# Patient Record
Sex: Male | Born: 1970 | Hispanic: No | Marital: Married | Smoking: Current every day smoker
Health system: Southern US, Community
[De-identification: ages and names within clinical notes are randomized; demographics above are authoritative.]

---

## 2014-04-19 ENCOUNTER — Emergency Department (HOSPITAL_COMMUNITY)
Admission: EM | Admit: 2014-04-19 | Discharge: 2014-04-19 | Disposition: A | Discharge status: Home / Self Care | Payer: Medicaid Other | Attending: Emergency Medicine | Admitting: Emergency Medicine

## 2014-04-19 ENCOUNTER — Encounter (HOSPITAL_COMMUNITY): Payer: Self-pay | Admitting: Emergency Medicine

## 2014-04-19 DIAGNOSIS — T24212A Burn of second degree of left thigh, initial encounter: Secondary | ICD-10-CM | POA: Diagnosis present

## 2014-04-19 DIAGNOSIS — Y9389 Activity, other specified: Secondary | ICD-10-CM | POA: Insufficient documentation

## 2014-04-19 DIAGNOSIS — T24202A Burn of second degree of unspecified site of left lower limb, except ankle and foot, initial encounter: Secondary | ICD-10-CM

## 2014-04-19 DIAGNOSIS — Z720 Tobacco use: Secondary | ICD-10-CM | POA: Diagnosis not present

## 2014-04-19 DIAGNOSIS — Z23 Encounter for immunization: Secondary | ICD-10-CM | POA: Diagnosis not present

## 2014-04-19 DIAGNOSIS — L03116 Cellulitis of left lower limb: Secondary | ICD-10-CM | POA: Diagnosis not present

## 2014-04-19 DIAGNOSIS — Y9289 Other specified places as the place of occurrence of the external cause: Secondary | ICD-10-CM | POA: Insufficient documentation

## 2014-04-19 DIAGNOSIS — X100XXA Contact with hot drinks, initial encounter: Secondary | ICD-10-CM | POA: Insufficient documentation

## 2014-04-19 MED ORDER — TETANUS-DIPHTH-ACELL PERTUSSIS 5-2.5-18.5 LF-MCG/0.5 IM SUSP
0.5000 mL | Freq: Once | INTRAMUSCULAR | Status: AC
  Administered 2014-04-19: 0.5 mL via INTRAMUSCULAR
  Filled 2014-04-19: qty 0.5

## 2014-04-19 MED ORDER — OXYCODONE-ACETAMINOPHEN 5-325 MG PO TABS: 1.0000 | ORAL_TABLET | Freq: Four times a day (QID) | ORAL | Status: AC | PRN

## 2014-04-19 MED ORDER — HYDROMORPHONE HCL 1 MG/ML IJ SOLN
1.0000 mg | Freq: Once | INTRAMUSCULAR | Status: AC
  Administered 2014-04-19: 1 mg via INTRAVENOUS
  Filled 2014-04-19: qty 1

## 2014-04-19 MED ORDER — CLINDAMYCIN HCL 300 MG PO CAPS
600.0000 mg | ORAL_CAPSULE | Freq: Once | ORAL | Status: AC
  Administered 2014-04-19: 600 mg via ORAL
  Filled 2014-04-19: qty 2

## 2014-04-19 MED ORDER — CLINDAMYCIN HCL 150 MG PO CAPS: 450.0000 mg | ORAL_CAPSULE | Freq: Three times a day (TID) | ORAL | Status: AC

## 2014-04-19 MED ORDER — SILVER SULFADIAZINE 1 % EX CREA
TOPICAL_CREAM | Freq: Once | CUTANEOUS | Status: AC
  Administered 2014-04-19: 09:00:00 via TOPICAL
  Filled 2014-04-19: qty 85
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
## 2014-04-19 NOTE — Discharge Instructions (Signed)
Burn Care °Burns hurt your skin. When your skin is hurt, it is easier to get an infection. Follow your doctor's directions to help prevent an infection. °HOME CARE °· Wash your hands well before you change your bandage. °· Change your bandage as often as told by your doctor. °¨ Remove the old bandage. If the bandage sticks, soak it off with cool, clean water. °¨ Gently clean the burn with mild soap and water. °¨ Pat the burn dry with a clean, dry cloth. °¨ Put a thin layer of medicated cream on the burn. °¨ Put a clean bandage on as told by your doctor. °¨ Keep the bandage clean and dry. °· Raise (elevate) the burn for the first 24 hours. After that, follow your doctor's directions. °· Only take medicine as told by your doctor. °GET HELP RIGHT AWAY IF:  °· You have too much pain. °· The skin near the burn is red, tender, puffy (swollen), or has red streaks. °· The burn area has yellowish white fluid (pus) or a bad smell coming from it. °· You have a fever. °MAKE SURE YOU:  °· Understand these instructions. °· Will watch your condition. °· Will get help right away if you are not doing well or get worse. °Document Released: 03/10/2008 Document Revised: 08/24/2011 Document Reviewed: 10/22/2010 °ExitCare® Patient Information ©2015 [employer]. This information is not intended to replace advice given to you by your health care provider. Make sure you discuss any questions you have with your health care provider. ° °
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
## 2014-06-13 ENCOUNTER — Encounter (HOSPITAL_COMMUNITY): Payer: Self-pay | Admitting: Emergency Medicine

## 2014-06-13 ENCOUNTER — Emergency Department (HOSPITAL_COMMUNITY)
Admission: EM | Admit: 2014-06-13 | Discharge: 2014-06-14 | Disposition: A | Discharge status: Home / Self Care | Payer: Medicaid Other | Attending: Emergency Medicine | Admitting: Emergency Medicine

## 2014-06-13 DIAGNOSIS — J329 Chronic sinusitis, unspecified: Secondary | ICD-10-CM

## 2014-06-13 DIAGNOSIS — J019 Acute sinusitis, unspecified: Secondary | ICD-10-CM | POA: Insufficient documentation

## 2014-06-13 DIAGNOSIS — R519 Headache, unspecified: Secondary | ICD-10-CM

## 2014-06-13 DIAGNOSIS — Z720 Tobacco use: Secondary | ICD-10-CM | POA: Diagnosis not present

## 2014-06-13 DIAGNOSIS — R51 Headache: Secondary | ICD-10-CM | POA: Diagnosis present

## 2014-06-13 NOTE — ED Notes (Signed)
Pt c/o 8/10 HA with nausea and blurred vision x 2 days. Pt states pain comes and go. Pt denies injury/trauma.

## 2014-06-13 NOTE — ED Provider Notes (Signed)
CSN: [id number]     Arrival date & time 06/13/14  2214 History   First MD Initiated Contact with Patient 06/13/14 2353     This chart was scribed for Geoffery Lyonsouglas Tomeka Kantner, MD by Arlan OrganAshley Leger, ED Scribe. This patient was seen in room D34C/D34C and the patient's care was started 11:57 PM.   Chief Complaint  Patient presents with  . Headache   The history is provided by the patient. No language interpreter was used.    HPI Comments: Howard Macias is a 43 y.o. male who presents to the Emergency Department complaining of intermittent pain to the back of the head x 10 days that has progressively worsened in last 2 days. Pain rated 8/10. No recent injury or trauma. Pt also reports intermittent visual changes, mild dizziness, and nausea he associates with pain. He has not tried any OTC medications or home remedies to help manage symptoms. No fever, chills, cough, rash, or congestion. Mr. Heloise BeechamLMustafa feels symptoms may be related to stress. Pt is otherwise healthy without any medical problems. No known allergies to medications.  History reviewed. No pertinent past medical history. History reviewed. No pertinent past surgical history. No family history on file. History  Substance Use Topics  . Smoking status: Current Every Day Smoker -- 0.50 packs/day    Types: Cigarettes  . Smokeless tobacco: Not on file  . Alcohol Use: Yes    Review of Systems  Constitutional: Negative for chills.  HENT: Negative for congestion.   Respiratory: Negative for cough and shortness of breath.   Cardiovascular: Negative for chest pain.  Skin: Negative for rash.  All other systems reviewed and are negative.     Allergies  Motrin  Home Medications   Prior to Admission medications   Medication Sig Start Date End Date Taking? Authorizing Provider  ranitidine (ZANTAC) 150 MG tablet Take 150 mg by mouth 2 (two) times daily as needed for heartburn.   Yes Historical Provider, MD  clindamycin (CLEOCIN) 150 MG capsule  Take 3 capsules (450 mg total) by mouth 3 (three) times daily. For 7 days Patient not taking: Reported on 06/13/2014 04/19/14   Toy CookeyMegan Docherty, MD  oxyCODONE-acetaminophen (PERCOCET) 5-325 MG per tablet Take 1-2 tablets by mouth every 6 (six) hours as needed. Patient not taking: Reported on 06/13/2014 04/19/14   Toy CookeyMegan Docherty, MD   Triage Vitals: BP 131/82 mmHg  Pulse 103  Temp(Src) 98.4 F (36.9 C) (Oral)  Resp 18  Ht 6\' 1"  (1.854 m)  Wt 222 lb (100.699 kg)  BMI 29.30 kg/m2  SpO2 100%   Physical Exam  Constitutional: He is oriented to person, place, and time. He appears well-developed and well-nourished.  HENT:  Head: Normocephalic and atraumatic.  Mouth/Throat: Oropharynx is clear and moist.  Eyes: EOM are normal. Pupils are equal, round, and reactive to light.  Neck: Normal range of motion. Neck supple.  Cardiovascular: Normal rate, regular rhythm, normal heart sounds and intact distal pulses.   No murmur heard. Pulmonary/Chest: Effort normal and breath sounds normal. No respiratory distress.  Abdominal: Soft. He exhibits no distension. There is no tenderness.  Musculoskeletal: Normal range of motion. He exhibits no edema.  Lymphadenopathy:    He has no cervical adenopathy.  Neurological: He is alert and oriented to person, place, and time. No cranial nerve deficit. He exhibits normal muscle tone. Coordination normal.  Skin: Skin is warm and dry.  Psychiatric: He has a normal mood and affect. Judgment normal.  Nursing note and vitals reviewed.  ED Course  Procedures (including critical care time)  DIAGNOSTIC STUDIES: Oxygen Saturation is 100% on RA, Normal by my interpretation.    COORDINATION OF CARE: 11:56 PM-Discussed treatment plan with pt at bedside and pt agreed to plan.     Labs Review Labs Reviewed - No data to display  Imaging Review No results found.   EKG Interpretation None      MDM   Final diagnoses:  None    Patient presents with  complaints of headache that has been ongoing for the past 10 days. It is located to the right parietal region and causes occasional blurry vision. His neurologic exam is nonfocal and head CT is unremarkable with the exception of chronic sinusitis. This will be treated with antibiotics, tramadol, and when necessary return.  I personally performed the services described in this documentation, which was scribed in my presence. The recorded information has been reviewed and is accurate.    Geoffery Lyonsouglas Kenyona Rena, MD 06/14/14 [phone]

## 2014-06-14 ENCOUNTER — Emergency Department (HOSPITAL_COMMUNITY): Payer: Medicaid Other

## 2014-06-14 ENCOUNTER — Encounter (HOSPITAL_COMMUNITY): Payer: Self-pay

## 2014-06-14 LAB — CBC WITH DIFFERENTIAL/PLATELET
BASOS ABS: 0 10*3/uL (ref 0.0–0.1)
Basophils Relative: 1 % (ref 0–1)
Eosinophils Absolute: 0.3 10*3/uL (ref 0.0–0.7)
Eosinophils Relative: 5 % (ref 0–5)
HCT: 45.1 % (ref 39.0–52.0)
Hemoglobin: 15.8 g/dL (ref 13.0–17.0)
Lymphocytes Relative: 38 % (ref 12–46)
Lymphs Abs: 2.2 10*3/uL (ref 0.7–4.0)
MCH: 27.3 pg (ref 26.0–34.0)
MCHC: 35 g/dL (ref 30.0–36.0)
MCV: 77.9 fL — ABNORMAL LOW (ref 78.0–100.0)
Monocytes Absolute: 0.5 10*3/uL (ref 0.1–1.0)
Monocytes Relative: 9 % (ref 3–12)
NEUTROS ABS: 2.8 10*3/uL (ref 1.7–7.7)
Neutrophils Relative %: 47 % (ref 43–77)
Platelets: 175 10*3/uL (ref 150–400)
RBC: 5.79 MIL/uL (ref 4.22–5.81)
RDW: 13.6 % (ref 11.5–15.5)
WBC: 5.8 10*3/uL (ref 4.0–10.5)

## 2014-06-14 LAB — BASIC METABOLIC PANEL
ANION GAP: 5 (ref 5–15)
BUN: 9 mg/dL (ref 6–23)
CALCIUM: 9.3 mg/dL (ref 8.4–10.5)
CO2: 29 mmol/L (ref 19–32)
Chloride: 106 mEq/L (ref 96–112)
Creatinine, Ser: 1.14 mg/dL (ref 0.50–1.35)
GFR calc Af Amer: >90 mL/min (ref >90)
GFR, EST NON AFRICAN AMERICAN: 78 mL/min — AB (ref >90)
Glucose, Bld: 74 mg/dL (ref 70–99)
Potassium: 4.1 mmol/L (ref 3.5–5.1)
Sodium: 140 mmol/L (ref 135–145)

## 2014-06-14 MED ORDER — PROMETHAZINE HCL 25 MG/ML IJ SOLN
25.0000 mg | Freq: Once | INTRAMUSCULAR | Status: AC
  Administered 2014-06-14: 25 mg via INTRAMUSCULAR
  Filled 2014-06-14: qty 1

## 2014-06-14 MED ORDER — TRAMADOL HCL 50 MG PO TABS: 50.0000 mg | ORAL_TABLET | Freq: Four times a day (QID) | ORAL | Status: AC | PRN

## 2014-06-14 MED ORDER — KETOROLAC TROMETHAMINE 30 MG/ML IJ SOLN
60.0000 mg | Freq: Once | INTRAMUSCULAR | Status: AC
  Administered 2014-06-14: 60 mg via INTRAMUSCULAR
  Filled 2014-06-14: qty 2

## 2014-06-14 MED ORDER — AZITHROMYCIN 250 MG PO TABS: ORAL_TABLET | ORAL | Status: AC

## 2014-06-14 NOTE — Discharge Instructions (Signed)
°  Zithromax as prescribed.  Tramadol as prescribed as needed for pain.  Follow-up with your primary Dr. if not improving in the next week, and return to the ER if your symptoms substantially worsen or change.   Sinus Headache A sinus headache is when your sinuses become clogged or swollen. Sinus headaches can range from mild to severe.  CAUSES A sinus headache can have different causes, such as:  Colds.  Sinus infections.  Allergies. SYMPTOMS  Symptoms of a sinus headache may vary and can include:  Headache.  Pain or pressure in the face.  Congested or runny nose.  Fever.  Inability to smell.  Pain in upper teeth. Weather changes can make symptoms worse. TREATMENT  The treatment of a sinus headache depends on the cause.  Sinus pain caused by a sinus infection may be treated with antibiotic medicine.  Sinus pain caused by allergies may be helped by allergy medicines (antihistamines) and medicated nasal sprays.  Sinus pain caused by congestion may be helped by flushing the nose and sinuses with saline solution. HOME CARE INSTRUCTIONS   If antibiotics are prescribed, take them as directed. Finish them even if you start to feel better.  Only take over-the-counter or prescription medicines for pain, discomfort, or fever as directed by your caregiver.  If you have congestion, use a nasal spray to help reduce pressure. SEEK IMMEDIATE MEDICAL CARE IF:  You have a fever.  You have headaches more than once a week.  You have sensitivity to light or sound.  You have repeated nausea and vomiting.  You have vision problems.  You have sudden, severe pain in your face or head.  You have a seizure.  You are confused.  Your sinus headaches do not get better after treatment. Many people think they have a sinus headache when they actually have migraines or tension headaches. MAKE SURE YOU:   Understand these instructions.  Will watch your condition.  Will get help  right away if you are not doing well or get worse. Document Released: 07/09/2004 Document Revised: 08/24/2011 Document Reviewed: 08/30/2010 [facility] Patient Information 2015 [place], [place]. This information is not intended to replace advice given to you by your health care provider. Make sure you discuss any questions you have with your health care provider.

## 2019-06-02 ENCOUNTER — Emergency Department (HOSPITAL_COMMUNITY): Payer: Medicaid Other

## 2019-06-02 ENCOUNTER — Other Ambulatory Visit: Payer: Self-pay

## 2019-06-02 ENCOUNTER — Emergency Department (HOSPITAL_COMMUNITY)
Admission: EM | Admit: 2019-06-02 | Discharge: 2019-06-02 | Disposition: A | Discharge status: Home / Self Care | Payer: Medicaid Other | Attending: Emergency Medicine | Admitting: Emergency Medicine

## 2019-06-02 DIAGNOSIS — M25511 Pain in right shoulder: Secondary | ICD-10-CM | POA: Insufficient documentation

## 2019-06-02 DIAGNOSIS — S0990XA Unspecified injury of head, initial encounter: Secondary | ICD-10-CM | POA: Insufficient documentation

## 2019-06-02 DIAGNOSIS — F17210 Nicotine dependence, cigarettes, uncomplicated: Secondary | ICD-10-CM | POA: Diagnosis not present

## 2019-06-02 DIAGNOSIS — M7918 Myalgia, other site: Secondary | ICD-10-CM

## 2019-06-02 DIAGNOSIS — Y939 Activity, unspecified: Secondary | ICD-10-CM | POA: Diagnosis not present

## 2019-06-02 DIAGNOSIS — Y929 Unspecified place or not applicable: Secondary | ICD-10-CM | POA: Insufficient documentation

## 2019-06-02 DIAGNOSIS — M791 Myalgia, unspecified site: Secondary | ICD-10-CM | POA: Diagnosis not present

## 2019-06-02 DIAGNOSIS — R079 Chest pain, unspecified: Secondary | ICD-10-CM | POA: Diagnosis not present

## 2019-06-02 DIAGNOSIS — S199XXA Unspecified injury of neck, initial encounter: Secondary | ICD-10-CM | POA: Diagnosis present

## 2019-06-02 DIAGNOSIS — S161XXA Strain of muscle, fascia and tendon at neck level, initial encounter: Secondary | ICD-10-CM | POA: Insufficient documentation

## 2019-06-02 DIAGNOSIS — Y999 Unspecified external cause status: Secondary | ICD-10-CM | POA: Insufficient documentation

## 2019-06-02 DIAGNOSIS — M25521 Pain in right elbow: Secondary | ICD-10-CM | POA: Insufficient documentation

## 2019-06-02 MED ORDER — CYCLOBENZAPRINE HCL 10 MG PO TABS: 10.0000 mg | ORAL_TABLET | Freq: Two times a day (BID) | ORAL | 0 refills | Status: AC | PRN
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
## 2019-06-02 NOTE — ED Notes (Signed)
Patient verbalizes understanding of discharge instructions. Opportunity for questioning and answers were provided. Armband removed by staff, pt discharged from ED.  
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
## 2019-08-28 ENCOUNTER — Ambulatory Visit: Payer: No Typology Code available for payment source

## 2019-08-29 ENCOUNTER — Other Ambulatory Visit: Payer: Self-pay

## 2019-08-29 ENCOUNTER — Ambulatory Visit: Payer: Medicaid Other | Attending: Internal Medicine

## 2019-08-29 DIAGNOSIS — Z20822 Contact with and (suspected) exposure to covid-19: Secondary | ICD-10-CM

## 2019-08-30 LAB — NOVEL CORONAVIRUS, NAA: SARS-CoV-2, NAA: NOT DETECTED
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
